# Patient Record
Sex: Female | Born: 1955 | Race: Black or African American | Hispanic: No | Marital: Married | Smoking: Never smoker
Health system: Southern US, Community
[De-identification: ages and names within clinical notes are randomized; demographics above are authoritative.]

---

## 2007-04-13 ENCOUNTER — Emergency Department: Payer: Self-pay

## 2008-06-11 ENCOUNTER — Inpatient Hospital Stay (HOSPITAL_COMMUNITY): Admission: EM | Admit: 2008-06-11 | Discharge: 2008-06-13 | Payer: Self-pay | Admitting: Emergency Medicine

## 2008-06-12 ENCOUNTER — Encounter (INDEPENDENT_AMBULATORY_CARE_PROVIDER_SITE_OTHER): Payer: Self-pay | Admitting: General Surgery

## 2010-11-13 NOTE — Op Note (Signed)
NAMEAIMA, MCWHIRT            ACCOUNT NO.:  [id number]   MEDICAL RECORD NO.:  [id number]          PATIENT TYPE:  INP   LOCATION:  5530                         FACILITY:  [facility]   PHYSICIAN:  Anselm Pancoast. Weatherly, M.D.DATE OF BIRTH:  15-Aug-1955   DATE OF PROCEDURE:  06/12/2008  DATE OF DISCHARGE:                               OPERATIVE REPORT   PREOPERATIVE DIAGNOSIS:  Acute cholecystitis with stones.   POSTOPERATIVE DIAGNOSIS:  Acute cholecystitis with stones.   OPERATION:  Laparoscopic cholecystectomy with cholangiogram.   SURGEON:  Anselm Pancoast. Zachery Dakins, MD   ASSISTANT:  Gabrielle Dare. Janee Morn, MD   ANESTHESIA:  General anesthesia.   HISTORY:  Breanna Welch is a 55 year old female who presented to the  emergency room yesterday with following history.  She said for about 3  weeks in a row after heavy meal and this has usually been Sunday lunch  after attending church.  She had severe epigastric pain predominantly in  right upper quadrant.  The pain at this time started on Saturday and she  presented to the emergency room.  She was seen by the ER physician and  was thought to have a gallbladder problem.  Ultrasound confirmed that  she had a thickened gallbladder with stones, and she was admitted and  Dr. Corliss Skains saw her and suggested that we plan on doing it tomorrow.  The  patient was started on Unasyn.  I saw her this morning, she states  actually she was feeling not worse, but of course she had not eaten.  She was still kind of slightly tender in the right upper quadrant and I  recommended we will proceed on laparoscopic cholecystectomy with  cholangiogram.  The patient was in agreement.  She had her dose of  Unasyn ordered for noon preoperatively and then positioned on OR table,  induction of  general anesthesia and endotracheal tube, oral tube to the  stomach, Dr. Katrinka Blazing the Anesthesiologist, and then the abdomen was  prepped with Betadine surgical solution and draped in a  sterile manner.  She has had a previous midline incision from a hysterectomy.  I made a  little incision right below the umbilicus.  The fascia was identified  and then carefully opened.  The underlying omentum was adherent to this,  but I went superiorly towards the subxiphoid area and could enter into  the preperitoneal cavity.  A pursestring suture of 0 Vicryl was placed  and Hasson cannula introduced.  She has a markedly inflamed gallbladder  thickened and the upper 10-mm trocar was placed in the subxiphoid area  after anesthetizing the fascia with Marcaine, a 2 lateral 5-mm trocars  were placed by Dr. Janee Morn.  First with the markedly inflamed  gallbladder ver tense, we could not grab it, and we aspirated with  Nezhat-Dorsey aspirator and then the adhesions were of acute  inflammatory.  We kind of swept away so that we could see the proximal  portion of the gallbladder.  We could see a large stone impacted in the  neck of the gallbladder cystic duct area, and I very carefully dissected  through this, could not  actually identify a lymph node, but I could see  where the lymph node is inferior and then that was carefully dissected  with right angle between the lymph node to gallbladder wall which gave  me a little window.  I could then sort of get behind the actual cystic  duct area, and I put a clip across the junction of the cystic duct  gallbladder.  The proximal cystic duct which appeared a low generous,  was opened and then a Cook catheter was introduced that I could hold in  place with a clip and then the x-ray was obtained and it showed a good  prompt filling of extrahepatic biliary cyst and it looks like we have  got about 1.5 cm cystic duct length.  I then removed the catheter and  kind of dissected slightly more proximal, so we can get around the  actual true cystic duct, it looks as if we first kind of little opening  was probably right at the cystic duct gallbladder junction  area, and  then I could get 3 clips across the cystic duct, divided it, and then we  very carefully divided this inflammatory area, could not actually see a  posterior cystic artery, but I placed a clip across something that may  have been the posterior artery and divided it.  We very cautiously and  carefully dissected the more proximal portion of the gallbladder.  There  was another little area that may have been sort of a branch of the  anterior branch of the cystic artery that was clipped, and then we were  kind of in the plane of the gallbladder liver junction.  This was kind  of carefully dissected using predominantly the spatula or  electrocautery, kind of freeing up everything in this big thick  inflammatory field.  They did not enter into the gallbladder.  She has  got numerous fairly good size stones in addition to a normal stone in  the most distal portion and after the gallbladder was freed, we put it  into the Endocatch bag.  I then kind of cauterized the liver bed and  looked like we have got good hemostasis, and then we switched the camera  to the upper 10-mm trocar and withdrew the gallbladder within the  Endocatch bag.  I opened the little incision just a little bit more  across to the umbilicus and when the gallbladder was out and I put an  additional figure-of-eight of 0 Vicryl in addition to the original  pursestring tied on both and then anesthetized the fascia.  The  irrigating fluid that we used was aspirated.  There were no evidence of  any bleeding or bile from the proximal gallbladder fossa area and the  omentum goes up into the area quite nicely and I did not use any  Surgicel, and we will keep her on.  The subcutaneous wounds were closed  with 4-0 Vicryl.  Benzoin and Steri-Strips on the skin.  We will keep  her tonight, gave her another 1 or 2 dose of antibiotics, and she should  be able to be discharged in the a.m.            ______________________________  Anselm Pancoast. Zachery Dakins, M.D.     WJW/MEDQ  D:  06/12/2008  T:  06/13/2008  Job:  [id number]

## 2010-11-13 NOTE — H&P (Signed)
Breanna Welch, Breanna Welch            ACCOUNT NO.:  [id number]   MEDICAL RECORD NO.:  [id number]          PATIENT TYPE:  INP   LOCATION:  5530                         FACILITY:  [facility]   PHYSICIAN:  Wilmon Arms. Corliss Skains, M.D. DATE OF BIRTH:  1955-12-26   DATE OF ADMISSION:  06/11/2008  DATE OF DISCHARGE:                              HISTORY & PHYSICAL   CHIEF COMPLAINT:  Right upper quadrant epigastric pain.   HISTORY OF PRESENT ILLNESS:  This is a 55 year old female who presents  with a 3-week history of four episodes of right upper quadrant  epigastric pain.  These are usually associated with large meals  typically after church on Sundays.  This is all associated with nausea,  vomiting, diarrhea, and bloating.  She denies any unusual color to her  bowel movements or her vomitus or urine.  Her skin does not appear  different color.  Since the pain kept recurring, the patient presented  to the emergency department for evaluation.   PAST MEDICAL HISTORY:  None.   PAST SURGICAL HISTORY:  Hysterectomy many years ago.   FAMILY HISTORY:  Unremarkable.   SOCIAL HISTORY:  Nonsmoker, nondrinker.   ALLERGIES:  None.   MEDICATIONS:  None.   REVIEW OF SYSTEMS:  Otherwise negative.   PHYSICAL EXAMINATION:  VITAL SIGNS:  Temp 97.8, pulse 83, respirations  18, blood pressure 139/94.  GENERAL:  This is a well-developed, well-nourished female in no apparent  distress.  HEENT:  EOMI.  Sclerae anicteric.  NECK:  No mass, no thyromegaly.  LUNGS:  Clear.  Normal respiratory effort.  HEART:  Regular rate and rhythm.  No murmur.  ABDOMEN:  Positive bowel sounds, soft, tender in the right upper  quadrant and epigastrium.  No palpable masses.  No rebound or guarding.  EXTREMITIES:  No edema.  SKIN:  Warm and dry.   LABORATORY DATA:  White count 9.7, hemoglobin 11.8.  Electrolytes within  normal limits.  Total bilirubin 0.5, alk phos 76, lipase 33.  Ultrasound  shows some gallbladder wall thickening  with pericholecystic fluid and  positive sonographic Murphy sign and cholelithiasis.   IMPRESSION:  Acute cholecystitis.   PLAN:  Admit for IV hydration and IV antibiotics.  We will plan for a  laparoscopic cholecystectomy probably tomorrow.      Wilmon Arms. Tsuei, M.D.  Electronically Signed     MKT/MEDQ  D:  06/11/2008  T:  06/12/2008  Job:  [id number]

## 2011-04-05 LAB — COMPREHENSIVE METABOLIC PANEL
AST: 18 U/L (ref 0–37)
AST: 25 U/L (ref 0–37)
Albumin: 3.9 g/dL (ref 3.5–5.2)
BUN: 3 mg/dL — ABNORMAL LOW (ref 6–23)
BUN: 7 mg/dL (ref 6–23)
CO2: 28 mEq/L (ref 19–32)
Calcium: 10.1 mg/dL (ref 8.4–10.5)
Calcium: 9.1 mg/dL (ref 8.4–10.5)
Creatinine, Ser: 0.71 mg/dL (ref 0.4–1.2)
Creatinine, Ser: 0.79 mg/dL (ref 0.4–1.2)
GFR calc Af Amer: >60 mL/min (ref >60)
GFR calc Af Amer: >60 mL/min (ref >60)
GFR calc non Af Amer: >60 mL/min (ref >60)
Glucose, Bld: 119 mg/dL — ABNORMAL HIGH (ref 70–99)
Total Bilirubin: 0.5 mg/dL (ref 0.3–1.2)
Total Protein: 8.1 g/dL (ref 6.0–8.3)

## 2011-04-05 LAB — DIFFERENTIAL
Basophils Absolute: 0 10*3/uL (ref 0.0–0.1)
Eosinophils Relative: 2 % (ref 0–5)
Lymphocytes Relative: 25 % (ref 12–46)
Lymphs Abs: 2.4 10*3/uL (ref 0.7–4.0)
Monocytes Absolute: 0.5 10*3/uL (ref 0.1–1.0)
Monocytes Relative: 5 % (ref 3–12)
Neutro Abs: 6.6 10*3/uL (ref 1.7–7.7)

## 2011-04-05 LAB — CBC
HCT: 36.3 % (ref 36.0–46.0)
Hemoglobin: 10.8 g/dL — ABNORMAL LOW (ref 12.0–15.0)
MCHC: 32.4 g/dL (ref 30.0–36.0)
MCHC: 33 g/dL (ref 30.0–36.0)
MCV: 82.9 fL (ref 78.0–100.0)
Platelets: 330 10*3/uL (ref 150–400)
RDW: 13.7 % (ref 11.5–15.5)
WBC: 9.1 10*3/uL (ref 4.0–10.5)
WBC: 9.7 10*3/uL (ref 4.0–10.5)

## 2011-04-05 LAB — URINALYSIS, ROUTINE W REFLEX MICROSCOPIC
Nitrite: NEGATIVE
Specific Gravity, Urine: 1.023 (ref 1.005–1.030)
Urobilinogen, UA: 0.2 mg/dL (ref 0.0–1.0)
pH: 5.5 (ref 5.0–8.0)

## 2011-04-05 LAB — URINE MICROSCOPIC-ADD ON

## 2012-01-07 ENCOUNTER — Ambulatory Visit: Payer: Self-pay | Admitting: Orthopedic Surgery

## 2012-01-07 LAB — URINALYSIS, COMPLETE
Bilirubin,UR: NEGATIVE
Glucose,UR: NEGATIVE mg/dL (ref 0–75)
Nitrite: NEGATIVE
Ph: 5 (ref 4.5–8.0)
RBC,UR: 2 /HPF (ref 0–5)

## 2012-01-07 LAB — CBC
Platelet: 266 10*3/uL (ref 150–440)
RBC: 4.23 10*6/uL (ref 3.80–5.20)
WBC: 6.1 10*3/uL (ref 3.6–11.0)

## 2012-01-07 LAB — BASIC METABOLIC PANEL
Anion Gap: 7 (ref 7–16)
Calcium, Total: 9.6 mg/dL (ref 8.5–10.1)
Co2: 27 mmol/L (ref 21–32)
Creatinine: 0.88 mg/dL (ref 0.60–1.30)
EGFR (African American): >60

## 2012-01-07 LAB — PROTIME-INR
INR: 1
Prothrombin Time: 13.3 secs (ref 11.5–14.7)

## 2012-01-15 ENCOUNTER — Ambulatory Visit: Payer: Self-pay | Admitting: Orthopedic Surgery

## 2013-04-19 ENCOUNTER — Emergency Department: Payer: Self-pay | Admitting: Emergency Medicine

## 2013-04-19 LAB — COMPREHENSIVE METABOLIC PANEL
Alkaline Phosphatase: 90 U/L (ref 50–136)
BUN: 14 mg/dL (ref 7–18)
Bilirubin,Total: 0.2 mg/dL (ref 0.2–1.0)
Calcium, Total: 9.5 mg/dL (ref 8.5–10.1)
Co2: 29 mmol/L (ref 21–32)
Glucose: 98 mg/dL (ref 65–99)
Potassium: 3.4 mmol/L — ABNORMAL LOW (ref 3.5–5.1)
SGOT(AST): 23 U/L (ref 15–37)
SGPT (ALT): 24 U/L (ref 12–78)

## 2013-04-19 LAB — CBC
HGB: 12.1 g/dL (ref 12.0–16.0)
MCV: 82 fL (ref 80–100)
RDW: 14.3 % (ref 11.5–14.5)
WBC: 10.9 10*3/uL (ref 3.6–11.0)

## 2013-04-19 LAB — URINALYSIS, COMPLETE
Ketone: NEGATIVE
Leukocyte Esterase: NEGATIVE
Nitrite: NEGATIVE
Ph: 7 (ref 4.5–8.0)
Squamous Epithelial: 1

## 2013-04-19 LAB — LIPASE, BLOOD: Lipase: 195 U/L (ref 73–393)

## 2013-04-19 LAB — CK TOTAL AND CKMB (NOT AT ARMC)
CK, Total: 336 U/L — ABNORMAL HIGH (ref 21–215)
CK-MB: 3.7 ng/mL — ABNORMAL HIGH (ref 0.5–3.6)

## 2013-04-19 LAB — PROTIME-INR: Prothrombin Time: 13.2 secs (ref 11.5–14.7)

## 2013-04-29 ENCOUNTER — Ambulatory Visit: Payer: Self-pay | Admitting: Family Medicine

## 2016-03-27 ENCOUNTER — Ambulatory Visit: Payer: Self-pay

## 2016-03-27 ENCOUNTER — Encounter: Payer: Self-pay | Admitting: *Deleted

## 2016-03-27 ENCOUNTER — Ambulatory Visit: Payer: Self-pay | Attending: Oncology | Admitting: *Deleted

## 2016-03-27 VITALS — BP 135/80 | HR 65 | Temp 98.6°F | Resp 20 | Ht 62.21 in | Wt 169.4 lb

## 2016-03-27 DIAGNOSIS — N6489 Other specified disorders of breast: Secondary | ICD-10-CM

## 2016-03-27 NOTE — Progress Notes (Signed)
Subjective:     Patient ID: Breanna Welch, female   DOB: [birth year], 60 y.o.   MRN: [id number]  HPI   Review of Systems     Objective:   Physical Exam  Pulmonary/Chest: Right breast exhibits no inverted nipple, no mass, no nipple discharge, no skin change and no tenderness. Left breast exhibits no inverted nipple, no mass, no nipple discharge, no skin change and no tenderness. Breasts are symmetrical.         Assessment:     60 year old Black female presents to [facility] for clinical breast exam and mammogram only.  Clinical breast exam with an asymmetric thickening in the upper outer quadrant of the left breast.  No dominant mass, skin changes or lymphadenopathy.  Taught self breast awareness.  Patient with partial hysterectomy in her 20's for a tubal pregnancy.  States she has one ovary and fallopian tube only.  Pap smear omitted per protocol. Patient has been screened for eligibility.  She does not have any insurance, Medicare or Medicaid.  She also meets financial eligibility.  Hand-out given on the Affordable Care Act.    Plan:     Bilateral diagnostic mammogram and ultrasound ordered.  Will follow up per [facility] protocol.

## 2016-03-27 NOTE — Patient Instructions (Signed)
Gave patient hand-out, Women Staying Healthy, Active and Well from [facility], with education on breast health, pap smears, heart and colon health. 

## 2016-04-09 ENCOUNTER — Ambulatory Visit
Admission: RE | Admit: 2016-04-09 | Discharge: 2016-04-09 | Disposition: A | Discharge status: Home / Self Care | Payer: Self-pay | Source: Ambulatory Visit | Attending: Oncology | Admitting: Oncology

## 2016-04-09 ENCOUNTER — Other Ambulatory Visit: Payer: Self-pay | Admitting: Oncology

## 2016-04-09 ENCOUNTER — Other Ambulatory Visit: Payer: Self-pay | Admitting: *Deleted

## 2016-04-09 DIAGNOSIS — N6489 Other specified disorders of breast: Secondary | ICD-10-CM

## 2016-04-09 DIAGNOSIS — N63 Unspecified lump in unspecified breast: Secondary | ICD-10-CM

## 2016-05-14 ENCOUNTER — Telehealth: Payer: Self-pay | Admitting: *Deleted

## 2016-05-14 NOTE — Telephone Encounter (Signed)
Patient had a birads 4 mammogram on 04/09/16.  She had told Samantha in the breast center she would call her back to schedule her biopsy.  It has been over a month and the patient had not scheduled an appointment.  Called her today.  States she is so afraid of biopsies because she "lost her mom 2 weeks after a biopsy."  Stressed importance of early detection.  She has agreed to schedule.  Could not get through to the breast center.  Sent in-basket message to [place] to schedule the patient.

## 2016-05-28 ENCOUNTER — Ambulatory Visit: Payer: Self-pay

## 2016-06-11 ENCOUNTER — Ambulatory Visit
Admission: RE | Admit: 2016-06-11 | Discharge: 2016-06-11 | Disposition: A | Discharge status: Home / Self Care | Payer: Self-pay | Source: Ambulatory Visit | Attending: Oncology | Admitting: Oncology

## 2016-06-11 DIAGNOSIS — N63 Unspecified lump in unspecified breast: Secondary | ICD-10-CM

## 2016-06-11 HISTORY — PX: BREAST BIOPSY: SHX20

## 2016-06-12 LAB — SURGICAL PATHOLOGY

## 2016-06-13 ENCOUNTER — Telehealth: Payer: Self-pay | Admitting: *Deleted

## 2016-06-13 NOTE — Telephone Encounter (Signed)
Called and informed patient of her benign breast biopsy.  Told her the radiologist will still need to look a the path report, and determine if everything was concordant.  Informed she may also hear from the breast center with her results.  Informed that she will need to return in one year for annual screening, unless the radiologist wants her back in six months.  I will let her know if anything changes.

## 2018-09-24 ENCOUNTER — Other Ambulatory Visit: Payer: Self-pay | Admitting: Family Medicine

## 2018-09-24 DIAGNOSIS — Z1231 Encounter for screening mammogram for malignant neoplasm of breast: Secondary | ICD-10-CM

## 2018-10-26 ENCOUNTER — Encounter: Payer: Self-pay | Admitting: *Deleted

## 2018-11-02 ENCOUNTER — Other Ambulatory Visit: Payer: Self-pay

## 2018-11-02 ENCOUNTER — Telehealth: Payer: Self-pay | Admitting: Gastroenterology

## 2018-11-02 DIAGNOSIS — Z1211 Encounter for screening for malignant neoplasm of colon: Secondary | ICD-10-CM

## 2018-11-02 NOTE — Telephone Encounter (Signed)
Gastroenterology Pre-Procedure Review  Request Date:12/25/18 Requesting Physician: Dr. Alex Gardener  PATIENT REVIEW QUESTIONS: The patient responded to the following health history questions as indicated:    1. Are you having any GI issues? No 2. Do you have a personal history of Polyps? No 3. Do you have a family history of Colon Cancer or Polyps? No 4. Diabetes Mellitus? No 5. Joint replacements in the past 12 months?No 6. Major health problems in the past 3 months?No 7. Any artificial heart valves, MVP, or defibrillator?No    MEDICATIONS & ALLERGIES:    Patient reports the following regarding taking any anticoagulation/antiplatelet therapy:   Plavix, Coumadin, Eliquis, Xarelto, Lovenox, Pradaxa, Brilinta, or Effient? No Aspirin? No  Patient confirms/reports the following medications:  No current outpatient medications on file.   No current facility-administered medications for this visit.     Patient confirms/reports the following allergies:  No Known Allergies  No orders of the defined types were placed in this encounter.   AUTHORIZATION INFORMATION Primary Insurance: 1D#: Group #:  Secondary Insurance: 1D#: Group #:  SCHEDULE INFORMATION: Date: 12/25/18 Time: Location:[facility]

## 2018-11-02 NOTE — Telephone Encounter (Signed)
Pt is calling she received letter to schedule a colonoscopy

## 2018-12-07 ENCOUNTER — Telehealth: Payer: Self-pay

## 2018-12-07 NOTE — Telephone Encounter (Signed)
LVM for pt to call office.  We need to reschedule her  Friday 12/25/18 Colonoscopy with Dr. Bonna Gains to 12/24/18 (Thursday) with Dr. Bonna Gains.  Thanks [employer]

## 2018-12-09 ENCOUNTER — Telehealth: Payer: Self-pay

## 2018-12-09 NOTE — Telephone Encounter (Signed)
Patient was originally scheduled for 12/25/18 with Dr. Bonna Gains, due to scheduling changes at [facility] we have rescheduled her to 01/05/19 with Dr. Allen Norris at [facility].  Patient has been informed to have her COVID test on 01/01/19 Friday between the hours of 10:30am-12:30pm.  Will send new instructions to her.  Thanks [employer]

## 2019-01-01 ENCOUNTER — Other Ambulatory Visit
Admission: RE | Admit: 2019-01-01 | Discharge: 2019-01-01 | Disposition: A | Discharge status: Home / Self Care | Payer: Managed Care, Other (non HMO) | Source: Ambulatory Visit | Attending: Gastroenterology | Admitting: Gastroenterology

## 2019-01-01 DIAGNOSIS — Z01812 Encounter for preprocedural laboratory examination: Secondary | ICD-10-CM | POA: Insufficient documentation

## 2019-01-01 DIAGNOSIS — Z1159 Encounter for screening for other viral diseases: Secondary | ICD-10-CM | POA: Diagnosis not present

## 2019-01-02 LAB — SARS CORONAVIRUS 2 (TAT 6-24 HRS): SARS Coronavirus 2: NEGATIVE

## 2019-01-04 ENCOUNTER — Encounter: Payer: Self-pay | Admitting: Anesthesiology

## 2019-01-05 ENCOUNTER — Ambulatory Visit: Payer: Managed Care, Other (non HMO) | Admitting: Anesthesiology

## 2019-01-05 ENCOUNTER — Other Ambulatory Visit: Payer: Self-pay

## 2019-01-05 ENCOUNTER — Encounter
Admission: RE | Disposition: A | Discharge status: Home / Self Care | Payer: Self-pay | Source: Home / Self Care | Attending: Gastroenterology

## 2019-01-05 ENCOUNTER — Encounter: Payer: Self-pay | Admitting: *Deleted

## 2019-01-05 ENCOUNTER — Ambulatory Visit
Admission: RE | Admit: 2019-01-05 | Discharge: 2019-01-05 | Disposition: A | Discharge status: Home / Self Care | Payer: Managed Care, Other (non HMO) | Attending: Gastroenterology | Admitting: Gastroenterology

## 2019-01-05 DIAGNOSIS — Z1211 Encounter for screening for malignant neoplasm of colon: Secondary | ICD-10-CM | POA: Diagnosis not present

## 2019-01-05 DIAGNOSIS — K640 First degree hemorrhoids: Secondary | ICD-10-CM | POA: Diagnosis not present

## 2019-01-05 HISTORY — PX: COLONOSCOPY WITH PROPOFOL: SHX5780

## 2019-01-05 SURGERY — COLONOSCOPY WITH PROPOFOL
Anesthesia: General

## 2019-01-05 MED ORDER — SODIUM CHLORIDE 0.9 % IV SOLN
INTRAVENOUS | Status: DC
  Administered 2019-01-05: 08:00:00 via INTRAVENOUS

## 2019-01-05 MED ORDER — PROPOFOL 10 MG/ML IV BOLUS
INTRAVENOUS | Status: DC | PRN
  Administered 2019-01-05: 50 mg via INTRAVENOUS
  Administered 2019-01-05 (×4): 20 mg via INTRAVENOUS
  Administered 2019-01-05: 30 mg via INTRAVENOUS
  Administered 2019-01-05: 20 mg via INTRAVENOUS

## 2019-01-05 NOTE — H&P (Signed)
   Lucilla Lame, MD [facility] [address], [place], [place] [address] Phone: [phone] Fax : [phone]  Primary Care Physician:  Frazier Richards, MD Primary Gastroenterologist:  Dr. Allen Norris  Pre-Procedure History & Physical: HPI:  Breanna Welch is a 63 y.o. female is here for a screening colonoscopy.   History reviewed. No pertinent past medical history.  Past Surgical History:  Procedure Laterality Date  . BREAST BIOPSY Left 06/11/2016   path pending    Prior to Admission medications   Not on File    Allergies as of 11/02/2018  . (No Known Allergies)    Family History  Problem Relation Age of Onset  . Breast cancer Neg Hx     Social History   Socioeconomic History  . Marital status: Married    Spouse name: Not on file  . Number of children: Not on file  . Years of education: Not on file  . Highest education level: Not on file  Occupational History  . Not on file  Social Needs  . Financial resource strain: Not on file  . Food insecurity    Worry: Not on file    Inability: Not on file  . Transportation needs    Medical: Not on file    Non-medical: Not on file  Tobacco Use  . Smoking status: Never Smoker  . Smokeless tobacco: Never Used  Substance and Sexual Activity  . Alcohol use: Never    Frequency: Never  . Drug use: Never  . Sexual activity: Not on file  Lifestyle  . Physical activity    Days per week: Not on file    Minutes per session: Not on file  . Stress: Not on file  Relationships  . Social Herbalist on phone: Not on file    Gets together: Not on file    Attends religious service: Not on file    Active member of club or organization: Not on file    Attends meetings of clubs or organizations: Not on file    Relationship status: Not on file  . Intimate partner violence    Fear of current or ex partner: Not on file    Emotionally abused: Not on file    Physically abused: Not on file    Forced sexual activity: Not  on file  Other Topics Concern  . Not on file  Social History Narrative  . Not on file    Review of Systems: See HPI, otherwise negative ROS  Physical Exam: BP 133/76   Pulse 62   Temp (!) 97 F (36.1 C) (Tympanic)   Resp 18   Ht 5\' 4"  (1.626 m)   Wt 77.1 kg   SpO2 100%   BMI 29.18 kg/m  General:   Alert,  pleasant and cooperative in NAD Head:  Normocephalic and atraumatic. Neck:  Supple; no masses or thyromegaly. Lungs:  Clear throughout to auscultation.    Heart:  Regular rate and rhythm. Abdomen:  Soft, nontender and nondistended. Normal bowel sounds, without guarding, and without rebound.   Neurologic:  Alert and  oriented x4;  grossly normal neurologically.  Impression/Plan: Breanna Welch is now here to undergo a screening colonoscopy.  Risks, benefits, and alternatives regarding colonoscopy have been reviewed with the patient.  Questions have been answered.  All parties agreeable.

## 2019-01-05 NOTE — Transfer of Care (Signed)
Immediate Anesthesia Transfer of Care Note  Patient: Breanna Welch  Procedure(s) Performed: COLONOSCOPY WITH PROPOFOL (N/A )  Patient Location: Endoscopy Unit  Anesthesia Type:General  Level of Consciousness: awake and drowsy  Airway & Oxygen Therapy: Patient Spontanous Breathing and Patient connected to face mask oxygen  Post-op Assessment: Report given to RN and Post -op Vital signs reviewed and stable  Post vital signs: Reviewed and stable  Last Vitals:  Vitals Value Taken Time  BP    Temp    Pulse    Resp    SpO2      Last Pain:  Vitals:   01/05/19 0805  TempSrc: Tympanic  PainSc: 0-No pain         Complications: No apparent anesthesia complications

## 2019-01-05 NOTE — Anesthesia Preprocedure Evaluation (Signed)
Anesthesia Evaluation  Patient identified by MRN, date of birth, ID band Patient awake    Reviewed: Allergy & Precautions, NPO status , Patient's Chart, lab work & pertinent test results, reviewed documented beta blocker date and time   Airway Mallampati: II  TM Distance: >3 FB     Dental  (+) Chipped   Pulmonary           Cardiovascular      Neuro/Psych    GI/Hepatic   Endo/Other    Renal/GU      Musculoskeletal   Abdominal   Peds  Hematology   Anesthesia Other Findings   Reproductive/Obstetrics                             Anesthesia Physical Anesthesia Plan  ASA: II  Anesthesia Plan: General   Post-op Pain Management:    Induction: Intravenous  PONV Risk Score and Plan:   Airway Management Planned:   Additional Equipment:   Intra-op Plan:   Post-operative Plan:   Informed Consent: I have reviewed the patients History and Physical, chart, labs and discussed the procedure including the risks, benefits and alternatives for the proposed anesthesia with the patient or authorized representative who has indicated his/her understanding and acceptance.     Plan Discussed with: CRNA  Anesthesia Plan Comments:         Anesthesia Quick Evaluation  

## 2019-01-05 NOTE — Anesthesia Postprocedure Evaluation (Signed)
Anesthesia Post Note  Patient: Breanna Welch  Procedure(s) Performed: COLONOSCOPY WITH PROPOFOL (N/A )  Patient location during evaluation: Endoscopy Anesthesia Type: General Level of consciousness: awake and alert Pain management: pain level controlled Vital Signs Assessment: post-procedure vital signs reviewed and stable Respiratory status: spontaneous breathing, nonlabored ventilation, respiratory function stable and patient connected to nasal cannula oxygen Cardiovascular status: blood pressure returned to baseline and stable Postop Assessment: no apparent nausea or vomiting Anesthetic complications: no     Last Vitals:  Vitals:   01/05/19 0914 01/05/19 0924  BP: (!) 109/57 116/62  Pulse: (!) 56 61  Resp: 11 15  Temp:    SpO2: 100% 100%    Last Pain:  Vitals:   01/05/19 0924  TempSrc:   PainSc: 0-No pain                 Hendricks Schwandt S

## 2019-01-05 NOTE — Op Note (Signed)
[facility] Gastroenterology Patient Name: Breanna Welch Procedure Date: 01/05/2019 8:47 AM MRN: [id number] Account #: [id number] Date of Birth: Sep 28, 1955 Admit Type: Outpatient Age: 63 Room: [facility] ENDO ROOM 4 Gender: Female Note Status: Finalized Procedure:            Colonoscopy Indications:          Screening for colorectal malignant neoplasm Providers:            Lucilla Lame MD, MD Referring MD:         Hattie Perch. Adamo (Referring MD) Medicines:            Propofol per Anesthesia Complications:        No immediate complications. Procedure:            Pre-Anesthesia Assessment:                       - Prior to the procedure, a History and Physical was                        performed, and patient medications and allergies were                        reviewed. The patient's tolerance of previous                        anesthesia was also reviewed. The risks and benefits of                        the procedure and the sedation options and risks were                        discussed with the patient. All questions were                        answered, and informed consent was obtained. Prior                        Anticoagulants: The patient has taken no previous                        anticoagulant or antiplatelet agents. ASA Grade                        Assessment: II - A patient with mild systemic disease.                        After reviewing the risks and benefits, the patient was                        deemed in satisfactory condition to undergo the                        procedure.                       After obtaining informed consent, the colonoscope was                        passed under direct vision. Throughout the procedure,  the patient's blood pressure, pulse, and oxygen                        saturations were monitored continuously. The                        Colonoscope was introduced through the anus and       advanced to the the cecum, identified by appendiceal                        orifice and ileocecal valve. The colonoscopy was                        performed without difficulty. The patient tolerated the                        procedure well. The quality of the bowel preparation                        was excellent. Findings:      The perianal and digital rectal examinations were normal.      Non-bleeding internal hemorrhoids were found during retroflexion. The       hemorrhoids were Grade I (internal hemorrhoids that do not prolapse). Impression:           - Non-bleeding internal hemorrhoids.                       - No specimens collected. Recommendation:       - Discharge patient to home.                       - Resume previous diet.                       - Continue present medications. Procedure Code(s):    --- Professional ---                       [phone], Colonoscopy, flexible; diagnostic, including                        collection of specimen(s) by brushing or washing, when                        performed (separate procedure) Diagnosis Code(s):    --- Professional ---                       Z12.11, Encounter for screening for malignant neoplasm                        of colon CPT copyright 2019 American Medical Association. All rights reserved. The codes documented in this report are preliminary and upon coder review may  be revised to meet current compliance requirements. Lucilla Lame MD, MD 01/05/2019 9:04:54 AM This report has been signed electronically. Number of Addenda: 0 Note Initiated On: 01/05/2019 8:47 AM Scope Withdrawal Time: 0 hours 6 minutes 36 seconds  Total Procedure Duration: 0 hours 9 minutes 17 seconds  Estimated Blood Loss: Estimated blood loss: none.      [employer]

## 2019-01-05 NOTE — Anesthesia Post-op Follow-up Note (Signed)
Anesthesia QCDR form completed.        

## 2019-01-06 ENCOUNTER — Encounter: Payer: Self-pay | Admitting: Gastroenterology

## 2020-03-13 ENCOUNTER — Emergency Department
Admission: EM | Admit: 2020-03-13 | Discharge: 2020-03-13 | Disposition: A | Discharge status: Home / Self Care | Payer: Medicare Other | Attending: Emergency Medicine | Admitting: Emergency Medicine

## 2020-03-13 ENCOUNTER — Encounter: Payer: Self-pay | Admitting: Emergency Medicine

## 2020-03-13 ENCOUNTER — Other Ambulatory Visit: Payer: Self-pay

## 2020-03-13 DIAGNOSIS — R1031 Right lower quadrant pain: Secondary | ICD-10-CM | POA: Insufficient documentation

## 2020-03-13 DIAGNOSIS — R300 Dysuria: Secondary | ICD-10-CM | POA: Diagnosis not present

## 2020-03-13 DIAGNOSIS — Z5321 Procedure and treatment not carried out due to patient leaving prior to being seen by health care provider: Secondary | ICD-10-CM | POA: Insufficient documentation

## 2020-03-13 LAB — COMPREHENSIVE METABOLIC PANEL
ALT: 16 U/L (ref 0–44)
AST: 16 U/L (ref 15–41)
Albumin: 4.1 g/dL (ref 3.5–5.0)
Alkaline Phosphatase: 61 U/L (ref 38–126)
Anion gap: 9 (ref 5–15)
BUN: 16 mg/dL (ref 8–23)
CO2: 26 mmol/L (ref 22–32)
Calcium: 10.1 mg/dL (ref 8.9–10.3)
Chloride: 106 mmol/L (ref 98–111)
Creatinine, Ser: 0.89 mg/dL (ref 0.44–1.00)
GFR calc Af Amer: >60 mL/min (ref >60)
GFR calc non Af Amer: >60 mL/min (ref >60)
Glucose, Bld: 94 mg/dL (ref 70–99)
Potassium: 3.8 mmol/L (ref 3.5–5.1)
Sodium: 141 mmol/L (ref 135–145)
Total Bilirubin: 0.5 mg/dL (ref 0.3–1.2)
Total Protein: 8 g/dL (ref 6.5–8.1)

## 2020-03-13 LAB — LIPASE, BLOOD: Lipase: 42 U/L (ref 11–51)

## 2020-03-13 LAB — URINALYSIS, COMPLETE (UACMP) WITH MICROSCOPIC
Bilirubin Urine: NEGATIVE
Glucose, UA: NEGATIVE mg/dL
Ketones, ur: NEGATIVE mg/dL
Nitrite: NEGATIVE
Protein, ur: NEGATIVE mg/dL
Specific Gravity, Urine: 1.012 (ref 1.005–1.030)
pH: 6 (ref 5.0–8.0)

## 2020-03-13 LAB — CBC
HCT: 35.3 % — ABNORMAL LOW (ref 36.0–46.0)
Hemoglobin: 11.5 g/dL — ABNORMAL LOW (ref 12.0–15.0)
MCH: 26.9 pg (ref 26.0–34.0)
MCHC: 32.6 g/dL (ref 30.0–36.0)
MCV: 82.7 fL (ref 80.0–100.0)
Platelets: 349 10*3/uL (ref 150–400)
RBC: 4.27 MIL/uL (ref 3.87–5.11)
RDW: 13.6 % (ref 11.5–15.5)
WBC: 9 10*3/uL (ref 4.0–10.5)
nRBC: 0 % (ref 0.0–0.2)

## 2020-03-13 NOTE — ED Triage Notes (Signed)
Here for RLQ pain for 4 days. Reports temp at home but does not remember what. No vomiting or diarrhea.  Mild dysuria at end of stream.  Unlabored. VSS

## 2020-03-14 ENCOUNTER — Other Ambulatory Visit: Payer: Self-pay

## 2020-03-14 ENCOUNTER — Emergency Department
Admission: EM | Admit: 2020-03-14 | Discharge: 2020-03-14 | Disposition: A | Discharge status: Home / Self Care | Payer: Medicare Other | Attending: Emergency Medicine | Admitting: Emergency Medicine

## 2020-03-14 ENCOUNTER — Encounter: Payer: Self-pay | Admitting: Radiology

## 2020-03-14 ENCOUNTER — Emergency Department: Payer: Medicare Other

## 2020-03-14 DIAGNOSIS — R1031 Right lower quadrant pain: Secondary | ICD-10-CM | POA: Diagnosis not present

## 2020-03-14 DIAGNOSIS — N3000 Acute cystitis without hematuria: Secondary | ICD-10-CM

## 2020-03-14 DIAGNOSIS — Y999 Unspecified external cause status: Secondary | ICD-10-CM | POA: Diagnosis not present

## 2020-03-14 DIAGNOSIS — R300 Dysuria: Secondary | ICD-10-CM | POA: Diagnosis not present

## 2020-03-14 DIAGNOSIS — X58XXXA Exposure to other specified factors, initial encounter: Secondary | ICD-10-CM | POA: Insufficient documentation

## 2020-03-14 DIAGNOSIS — R109 Unspecified abdominal pain: Secondary | ICD-10-CM | POA: Diagnosis present

## 2020-03-14 DIAGNOSIS — T148XXA Other injury of unspecified body region, initial encounter: Secondary | ICD-10-CM

## 2020-03-14 DIAGNOSIS — Y939 Activity, unspecified: Secondary | ICD-10-CM | POA: Insufficient documentation

## 2020-03-14 DIAGNOSIS — Y9289 Other specified places as the place of occurrence of the external cause: Secondary | ICD-10-CM | POA: Diagnosis not present

## 2020-03-14 DIAGNOSIS — S39011A Strain of muscle, fascia and tendon of abdomen, initial encounter: Secondary | ICD-10-CM | POA: Diagnosis not present

## 2020-03-14 LAB — URINALYSIS, ROUTINE W REFLEX MICROSCOPIC
Bilirubin Urine: NEGATIVE
Glucose, UA: NEGATIVE mg/dL
Ketones, ur: NEGATIVE mg/dL
Nitrite: NEGATIVE
Protein, ur: NEGATIVE mg/dL
Specific Gravity, Urine: 1.013 (ref 1.005–1.030)
pH: 5 (ref 5.0–8.0)

## 2020-03-14 LAB — COMPREHENSIVE METABOLIC PANEL
ALT: 17 U/L (ref 0–44)
AST: 16 U/L (ref 15–41)
Albumin: 4.2 g/dL (ref 3.5–5.0)
Alkaline Phosphatase: 63 U/L (ref 38–126)
Anion gap: 9 (ref 5–15)
BUN: 13 mg/dL (ref 8–23)
CO2: 27 mmol/L (ref 22–32)
Calcium: 10.1 mg/dL (ref 8.9–10.3)
Chloride: 104 mmol/L (ref 98–111)
Creatinine, Ser: 0.87 mg/dL (ref 0.44–1.00)
GFR calc Af Amer: >60 mL/min (ref >60)
GFR calc non Af Amer: >60 mL/min (ref >60)
Glucose, Bld: 90 mg/dL (ref 70–99)
Potassium: 3.9 mmol/L (ref 3.5–5.1)
Sodium: 140 mmol/L (ref 135–145)
Total Bilirubin: 0.5 mg/dL (ref 0.3–1.2)
Total Protein: 8.3 g/dL — ABNORMAL HIGH (ref 6.5–8.1)

## 2020-03-14 LAB — CBC WITH DIFFERENTIAL/PLATELET
Abs Immature Granulocytes: 0.03 10*3/uL (ref 0.00–0.07)
Basophils Absolute: 0.1 10*3/uL (ref 0.0–0.1)
Basophils Relative: 1 %
Eosinophils Absolute: 0.1 10*3/uL (ref 0.0–0.5)
Eosinophils Relative: 2 %
HCT: 37.9 % (ref 36.0–46.0)
Hemoglobin: 11.9 g/dL — ABNORMAL LOW (ref 12.0–15.0)
Immature Granulocytes: 0 %
Lymphocytes Relative: 31 %
Lymphs Abs: 2.7 10*3/uL (ref 0.7–4.0)
MCH: 26.7 pg (ref 26.0–34.0)
MCHC: 31.4 g/dL (ref 30.0–36.0)
MCV: 85 fL (ref 80.0–100.0)
Monocytes Absolute: 0.5 10*3/uL (ref 0.1–1.0)
Monocytes Relative: 6 %
Neutro Abs: 5.4 10*3/uL (ref 1.7–7.7)
Neutrophils Relative %: 60 %
Platelets: 352 10*3/uL (ref 150–400)
RBC: 4.46 MIL/uL (ref 3.87–5.11)
RDW: 13.6 % (ref 11.5–15.5)
WBC: 8.8 10*3/uL (ref 4.0–10.5)
nRBC: 0 % (ref 0.0–0.2)

## 2020-03-14 MED ORDER — KETOROLAC TROMETHAMINE 30 MG/ML IJ SOLN
15.0000 mg | Freq: Once | INTRAMUSCULAR | Status: AC
  Administered 2020-03-14: 15 mg via INTRAVENOUS
  Filled 2020-03-14: qty 1

## 2020-03-14 MED ORDER — ACETAMINOPHEN 500 MG PO TABS: 1000.0000 mg | ORAL_TABLET | Freq: Once | ORAL | Status: DC

## 2020-03-14 MED ORDER — CEPHALEXIN 500 MG PO CAPS: 500.0000 mg | ORAL_CAPSULE | Freq: Four times a day (QID) | ORAL | 0 refills | Status: AC

## 2020-03-14 MED ORDER — MORPHINE SULFATE (PF) 2 MG/ML IV SOLN
2.0000 mg | Freq: Once | INTRAVENOUS | Status: DC
  Filled 2020-03-14: qty 1

## 2020-03-14 MED ORDER — IOHEXOL 300 MG/ML  SOLN
100.0000 mL | Freq: Once | INTRAMUSCULAR | Status: AC | PRN
  Administered 2020-03-14: 100 mL via INTRAVENOUS

## 2020-03-14 NOTE — Discharge Instructions (Addendum)
Please take Tylenol and ibuprofen/Advil for your pain.  It is safe to take them together, or to alternate them every few hours.  Take up to 1000mg  of Tylenol at a time, up to 4 times per day.  Do not take more than 4000 mg of Tylenol in 24 hours.  For ibuprofen, take 400-600 mg, 4-5 times per day.  Your CT scan and blood work were normal.  With your symptoms of burning, your urine does have some signs of infection.  You are being discharged a prescription for Keflex antibiotic to take 4 times daily for the next 3 days to treat early UTI.  Please finish all these pills even if you are feeling better.  Return to the ED with any worsening symptoms.

## 2020-03-14 NOTE — ED Provider Notes (Signed)
[facility] Emergency Department Provider Note ____________________________________________   First MD Initiated Contact with Patient 03/14/20 1117     (approximate)  I have reviewed the triage vital signs and the nursing notes.  HISTORY  Chief Complaint Abdominal Pain   HPI Breanna Welch is a 64 y.o. femalewho presents to the ED for evaluation of abdominal pain.   Chart review indicates screening colonoscopy 1 year ago with asymptomatic internal hemorrhoids, otherwise normal. Patient checked into her ED yesterday afternoon and had blood work drawn that was without acute pathology.  UA with 11-20 whites that is nitrite negative.  Due to wait times, she went home and then returned again this morning.    Patient reports 2 weeks of intermittent RLQ abdominal pain.  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  She reports the pain subsided with home ibuprofen use, but continued to return and is lasted longer than she expected.  She reports 7/10 aching RLQ pain that is constant and nonradiating.  She reports developing associated dysuria over the past 1-2 weeks, which is new.  She reports associated subjective fevers and chills without documented temperatures.  Patient denies any vaginal discharge or bleeding, diarrhea, vomiting, chest pain, shortness of breath or syncope.  History reviewed. No pertinent past medical history.  Patient Active Problem List   Diagnosis Date Noted  . Encounter for screening colonoscopy     Past Surgical History:  Procedure Laterality Date  . BREAST BIOPSY Left 06/11/2016   path pending  . COLONOSCOPY WITH PROPOFOL N/A 01/05/2019   Procedure: COLONOSCOPY WITH PROPOFOL;  Surgeon: Lucilla Lame, MD;  Location: [facility] ENDOSCOPY;  Service: Endoscopy;  Laterality: N/A;    Prior to Admission medications   Medication Sig Start  Date End Date Taking? Authorizing Provider  cephALEXin (KEFLEX) 500 MG capsule Take 1 capsule (500 mg total) by mouth 4 (four) times daily for 3 days. 03/14/20 03/17/20  Vladimir Crofts, MD    Allergies Patient has no known allergies.  Family History  Problem Relation Age of Onset  . Breast cancer Neg Hx     Social History Social History   Tobacco Use  . Smoking status: Never Smoker  . Smokeless tobacco: Never Used  Vaping Use  . Vaping Use: Never used  Substance Use Topics  . Alcohol use: Never  . Drug use: Never    Review of Systems  Constitutional: No fever/chills Eyes: No visual changes. ENT: No sore throat. Cardiovascular: Denies chest pain. Respiratory: Denies shortness of breath. Gastrointestinal: .  No nausea, no vomiting.  No diarrhea.  Positive for abdominal pain, constipation. Genitourinary: Positive for dysuria. Musculoskeletal: Negative for back pain. Skin: Negative for rash. Neurological: Negative for headaches, focal weakness or numbness. ____________________________________________   PHYSICAL EXAM:  VITAL SIGNS: Vitals:   03/14/20 0856 03/14/20 1147  BP: (!) 152/81 (!) 149/84  Pulse: 60 (!) 56  Resp: 16 16  Temp: 98 F (36.7 C)   SpO2: 99% 100%      Constitutional: Alert and oriented. Well appearing and in no acute distress. Eyes: Conjunctivae are normal. PERRL. EOMI. Head: Atraumatic. Nose: No congestion/rhinnorhea. Mouth/Throat: Mucous membranes are moist.  Oropharynx non-erythematous. Neck: No stridor. No cervical spine tenderness to palpation. Cardiovascular: Normal rate, regular rhythm. Grossly normal heart sounds.  Good peripheral circulation. Respiratory: Normal respiratory effort.  No retractions. Lungs CTAB. Gastrointestinal: Soft , nondistended. No  abdominal bruits. No CVA tenderness. RLQ and suprapubic tenderness to palpation without peritoneal features Musculoskeletal: No lower extremity tenderness nor edema.  No joint effusions.  No signs of acute trauma. Neurologic:  Normal speech and language. No gross focal neurologic deficits are appreciated. No gait instability noted. Skin:  Skin is warm, dry and intact. No rash noted. Psychiatric: Mood and affect are normal. Speech and behavior are normal.  ____________________________________________   LABS (all labs ordered are listed, but only abnormal results are displayed)  Labs Reviewed  COMPREHENSIVE METABOLIC PANEL - Abnormal; Notable for the following components:      Result Value   Total Protein 8.3 (*)    All other components within normal limits  CBC WITH DIFFERENTIAL/PLATELET - Abnormal; Notable for the following components:   Hemoglobin 11.9 (*)    All other components within normal limits  URINALYSIS, ROUTINE W REFLEX MICROSCOPIC - Abnormal; Notable for the following components:   Color, Urine YELLOW (*)    APPearance HAZY (*)    Hgb urine dipstick MODERATE (*)    Leukocytes,Ua SMALL (*)    Bacteria, UA RARE (*)    All other components within normal limits   ____________________________________________  RADIOLOGY  ED MD interpretation:   Official radiology report(s): CT ABDOMEN PELVIS W CONTRAST  Result Date: 03/14/2020 CLINICAL DATA:  Acute left lower quadrant abdominal pain. EXAM: CT ABDOMEN AND PELVIS WITH CONTRAST TECHNIQUE: Multidetector CT imaging of the abdomen and pelvis was performed using the standard protocol following bolus administration of intravenous contrast. CONTRAST:  17mL OMNIPAQUE IOHEXOL 300 MG/ML  SOLN COMPARISON:  April 19, 2013. FINDINGS: Lower chest: No acute abnormality. Hepatobiliary: No focal liver abnormality is seen. Status post cholecystectomy. No biliary dilatation. Pancreas: Unremarkable. No pancreatic ductal dilatation or surrounding inflammatory changes. Spleen: Normal in size without focal abnormality. Adrenals/Urinary Tract: Adrenal glands are unremarkable. Kidneys are normal, without renal calculi, focal lesion,  or hydronephrosis. Bladder is unremarkable. Stomach/Bowel: Stomach is within normal limits. Appendix appears normal. No evidence of bowel wall thickening, distention, or inflammatory changes. Vascular/Lymphatic: No significant vascular findings are present. No enlarged abdominal or pelvic lymph nodes. Reproductive: Status post hysterectomy. No adnexal masses. Other: No abdominal wall hernia or abnormality. No abdominopelvic ascites. Musculoskeletal: No acute or significant osseous findings. IMPRESSION: No acute abnormality seen in the abdomen or pelvis. Electronically Signed   By: Marijo Conception M.D.   On: 03/14/2020 13:54    ____________________________________________   PROCEDURES and INTERVENTIONS  Procedure(s) performed (including Critical Care):  Procedures  Medications  ketorolac (TORADOL) 30 MG/ML injection 15 mg (15 mg Intravenous Given 03/14/20 1230)  iohexol (OMNIPAQUE) 300 MG/ML solution 100 mL (100 mLs Intravenous Contrast Given 03/14/20 1333)    ____________________________________________   MDM / ED COURSE  64 year old woman presenting with RLQ abdominal pain most consistent with muscular strain versus acute cystitis and amenable to outpatient management.  Normal vital signs on room air.  Exam demonstrates a well-appearing patient without evidence of distress, neurovascular deficits or trauma.  She is mildly tender throughout her RLQ without peritoneal features, and otherwise has a benign abdomen and examination.  Blood work from yesterday and repeated today without acute derangements.  Urinalysis, considering her symptoms of dysuria, are most consistent with acute cystitis and so patient was started on a short course of Keflex and this urine was sent for culture.  CT imaging without evidence of acute pathology within her abdomen as a source of her pain.  Considering resolution after Toradol and her HPI,  she most likely had a muscular strain or spasm from that lifting mechanism 2  weeks ago with the source of her pain.  She has no evidence of pyelonephritis or more systemic illness require longer course of antibiotics, the patient was discharged with a 3-day course of Keflex to treat acute cystitis.  Patient has good follow-up with PCP.  We discussed return precautions for the ED.  Patient medically stable for discharge home.  Clinical Course as of Mar 14 1405  Tue Mar 14, 2020  1254 Reassessed.  Patient reports resolution of abdominal pain symptoms.  Awaiting CT imaging.  I advised her of my suspicion that this was a muscular spasm, but it is reasonable to go ahead with CT to be safe, and she is agreeable.  We discussed management of acute uncomplicated cystitis with short course of antibiotics.   [DS]  1403 Educated patient of reassuring CT results and plan to discharge with antibiotics to treat acute cystitis.  She is agreeable.   [DS]    Clinical Course User Index [DS] Vladimir Crofts, MD     ____________________________________________   FINAL CLINICAL IMPRESSION(S) / ED DIAGNOSES  Final diagnoses:  Dysuria  Acute cystitis without hematuria  RLQ abdominal pain  Muscle strain     ED Discharge Orders         Ordered    cephALEXin (KEFLEX) 500 MG capsule  4 times daily        03/14/20 1401           Meridian Scherger   Note:  This document was prepared using Systems analyst and may include unintentional dictation errors.   Vladimir Crofts, MD 03/14/20 [phone]

## 2020-03-14 NOTE — ED Notes (Signed)
Patient transported to CT 

## 2020-03-14 NOTE — ED Triage Notes (Signed)
Pt here with lower abd pain that has been going on for 1 week. Pt denies N, V, D. Pt seen here yesterday for same reason. Pt NAD in triage.

## 2020-03-14 NOTE — ED Notes (Signed)
Pt was here yesterday and does not want blood drawn again before seeing a provider.

## 2020-03-16 LAB — URINE CULTURE: Culture: >=100000 — AB

## 2020-08-28 ENCOUNTER — Other Ambulatory Visit: Payer: Self-pay | Admitting: Family Medicine

## 2020-08-28 DIAGNOSIS — Z1231 Encounter for screening mammogram for malignant neoplasm of breast: Secondary | ICD-10-CM

## 2020-08-28 DIAGNOSIS — D242 Benign neoplasm of left breast: Secondary | ICD-10-CM

## 2020-09-08 ENCOUNTER — Other Ambulatory Visit: Payer: PRIVATE HEALTH INSURANCE

## 2020-09-08 ENCOUNTER — Other Ambulatory Visit: Payer: Self-pay

## 2020-09-08 NOTE — Patient Outreach (Signed)
Care Coordination  09/08/2020  Rasheedah Reis Dula 1955-09-09 [id number]  Patient no longer has MM [employer].  Mickel Fuchs, BSW, [place]  High Risk Managed Medicaid Team

## 2021-10-03 ENCOUNTER — Other Ambulatory Visit: Payer: Self-pay | Admitting: Family Medicine

## 2021-10-03 DIAGNOSIS — Z0000 Encounter for general adult medical examination without abnormal findings: Secondary | ICD-10-CM | POA: Diagnosis not present

## 2021-10-03 DIAGNOSIS — Z1231 Encounter for screening mammogram for malignant neoplasm of breast: Secondary | ICD-10-CM | POA: Diagnosis not present

## 2021-10-03 DIAGNOSIS — Z780 Asymptomatic menopausal state: Secondary | ICD-10-CM | POA: Diagnosis not present

## 2021-10-03 DIAGNOSIS — Z1283 Encounter for screening for malignant neoplasm of skin: Secondary | ICD-10-CM | POA: Diagnosis not present

## 2021-10-03 DIAGNOSIS — E782 Mixed hyperlipidemia: Secondary | ICD-10-CM | POA: Diagnosis not present

## 2021-10-03 DIAGNOSIS — R7303 Prediabetes: Secondary | ICD-10-CM | POA: Diagnosis not present

## 2021-10-30 ENCOUNTER — Ambulatory Visit
Admission: RE | Admit: 2021-10-30 | Discharge: 2021-10-30 | Disposition: A | Discharge status: Home / Self Care | Payer: No Typology Code available for payment source | Source: Ambulatory Visit | Attending: Family Medicine | Admitting: Family Medicine

## 2021-10-30 DIAGNOSIS — Z1231 Encounter for screening mammogram for malignant neoplasm of breast: Secondary | ICD-10-CM | POA: Diagnosis not present

## 2021-11-30 DIAGNOSIS — D225 Melanocytic nevi of trunk: Secondary | ICD-10-CM | POA: Diagnosis not present

## 2021-11-30 DIAGNOSIS — D2261 Melanocytic nevi of right upper limb, including shoulder: Secondary | ICD-10-CM | POA: Diagnosis not present

## 2021-11-30 DIAGNOSIS — L821 Other seborrheic keratosis: Secondary | ICD-10-CM | POA: Diagnosis not present

## 2021-11-30 DIAGNOSIS — D2262 Melanocytic nevi of left upper limb, including shoulder: Secondary | ICD-10-CM | POA: Diagnosis not present

## 2021-11-30 DIAGNOSIS — D2272 Melanocytic nevi of left lower limb, including hip: Secondary | ICD-10-CM | POA: Diagnosis not present

## 2022-01-21 DIAGNOSIS — R7303 Prediabetes: Secondary | ICD-10-CM | POA: Diagnosis not present

## 2022-01-21 DIAGNOSIS — E785 Hyperlipidemia, unspecified: Secondary | ICD-10-CM | POA: Diagnosis not present

## 2022-03-27 DIAGNOSIS — Z008 Encounter for other general examination: Secondary | ICD-10-CM | POA: Diagnosis not present

## 2022-03-27 DIAGNOSIS — E669 Obesity, unspecified: Secondary | ICD-10-CM | POA: Diagnosis not present

## 2022-03-27 DIAGNOSIS — Z6830 Body mass index (BMI) 30.0-30.9, adult: Secondary | ICD-10-CM | POA: Diagnosis not present

## 2022-03-27 DIAGNOSIS — R7303 Prediabetes: Secondary | ICD-10-CM | POA: Diagnosis not present

## 2022-03-27 DIAGNOSIS — E785 Hyperlipidemia, unspecified: Secondary | ICD-10-CM | POA: Diagnosis not present

## 2022-04-17 DIAGNOSIS — R69 Illness, unspecified: Secondary | ICD-10-CM | POA: Diagnosis not present

## 2022-04-22 DIAGNOSIS — E782 Mixed hyperlipidemia: Secondary | ICD-10-CM | POA: Diagnosis not present

## 2022-04-22 DIAGNOSIS — E785 Hyperlipidemia, unspecified: Secondary | ICD-10-CM | POA: Diagnosis not present

## 2022-04-22 DIAGNOSIS — R7303 Prediabetes: Secondary | ICD-10-CM | POA: Diagnosis not present

## 2022-05-01 DIAGNOSIS — R69 Illness, unspecified: Secondary | ICD-10-CM | POA: Diagnosis not present

## 2022-06-09 IMAGING — MG MM DIGITAL SCREENING BILAT W/ TOMO AND CAD
8 series · 8 of 24 positions shown · non-contrast
Comparison: Previous exam(s).

CLINICAL DATA: Screening.

EXAM:
DIGITAL SCREENING BILATERAL MAMMOGRAM WITH TOMOSYNTHESIS AND CAD
TECHNIQUE: Bilateral screening digital craniocaudal and mediolateral oblique
mammograms were obtained. Bilateral screening digital breast
tomosynthesis was performed. The images were evaluated with
computer-aided detection.

[R CC synth-2D]
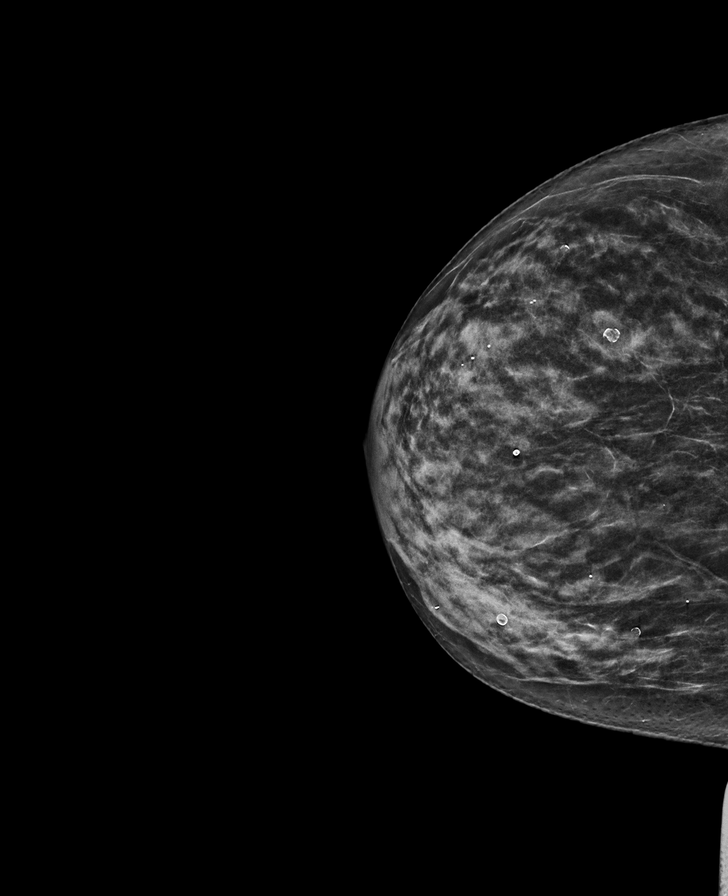

[L MLO synth-2D]
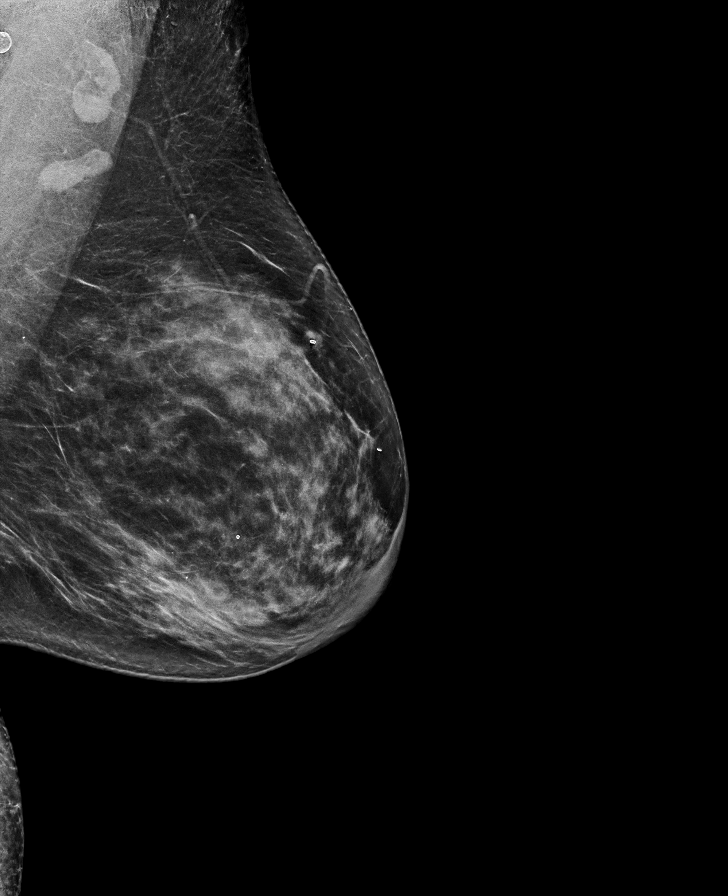

[R MLO synth-2D]
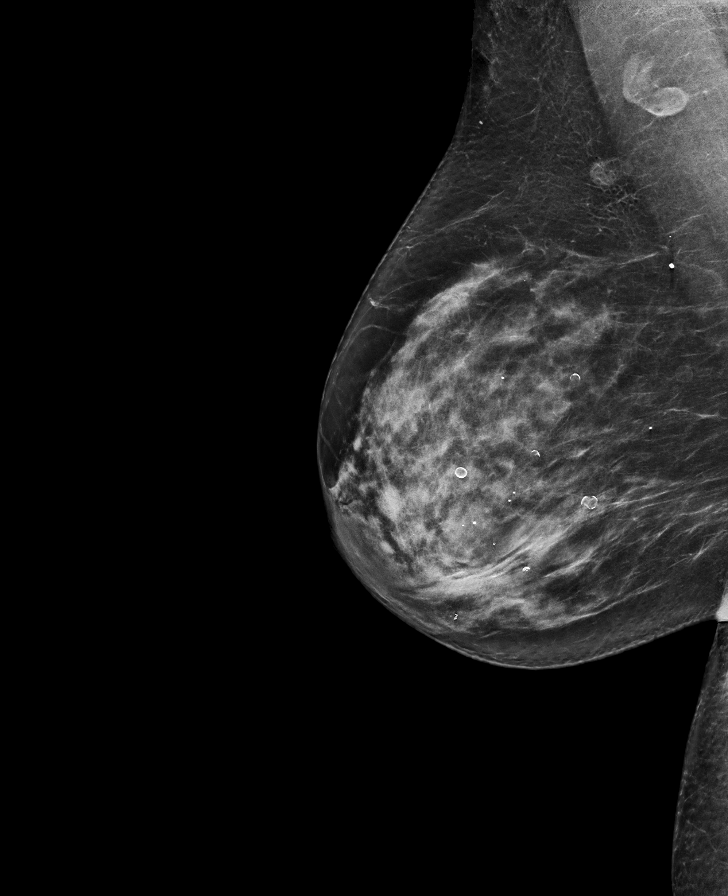

[L CC synth-2D]
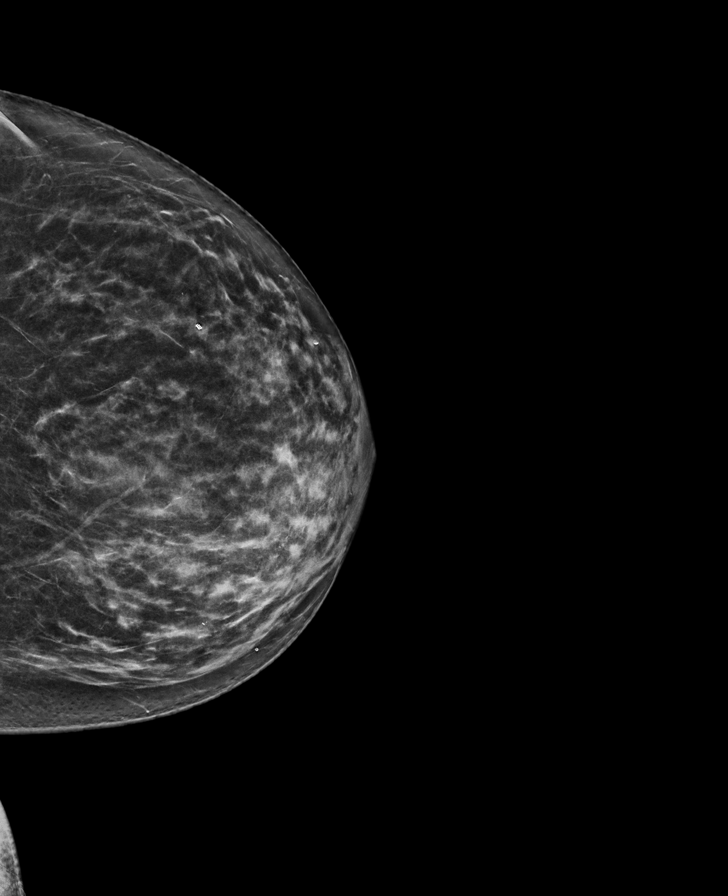

[R MLO tomo · tomo slice 39/78.0]
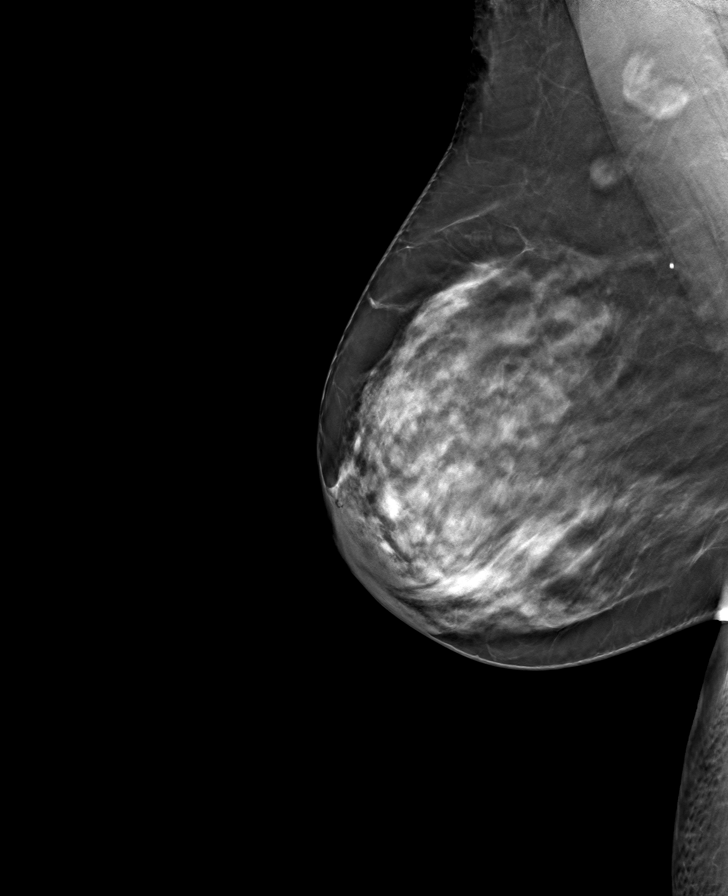

[R CC tomo · tomo slice 31/62.0]
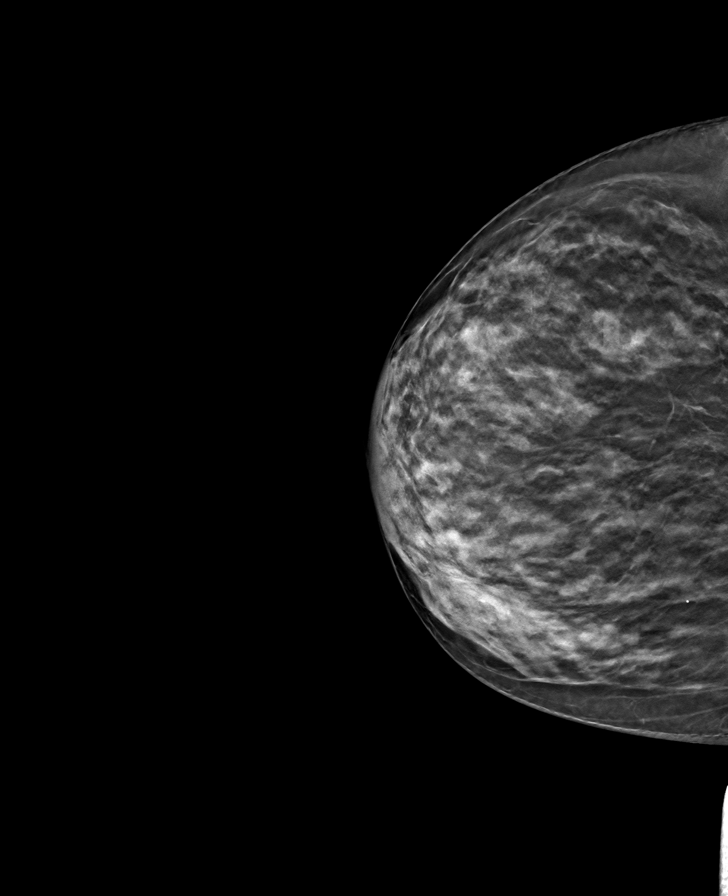

[L CC tomo · tomo slice 33/66.0]
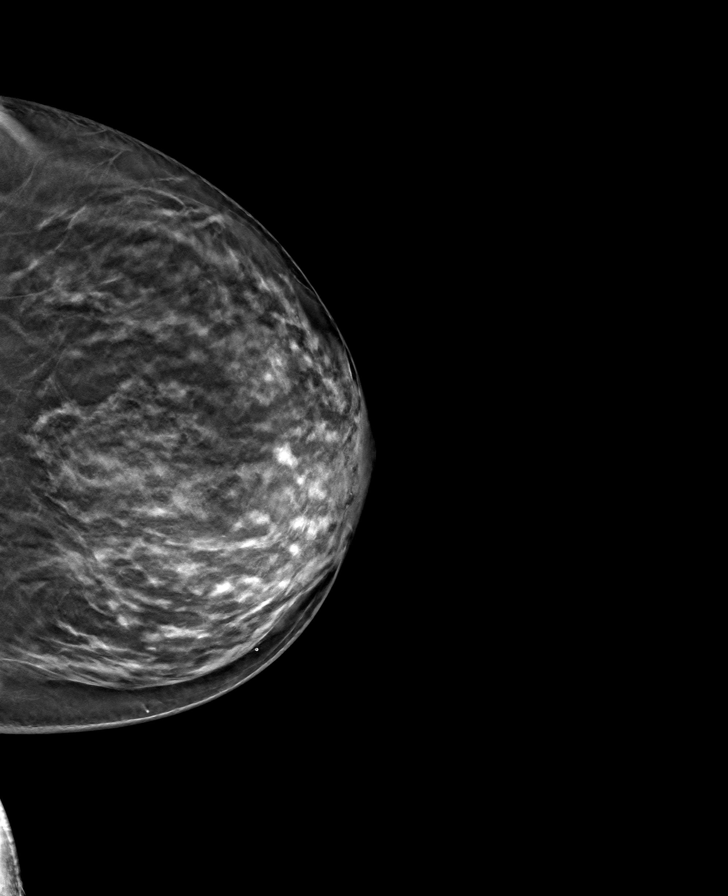

[L MLO tomo · tomo slice 41/80.0]
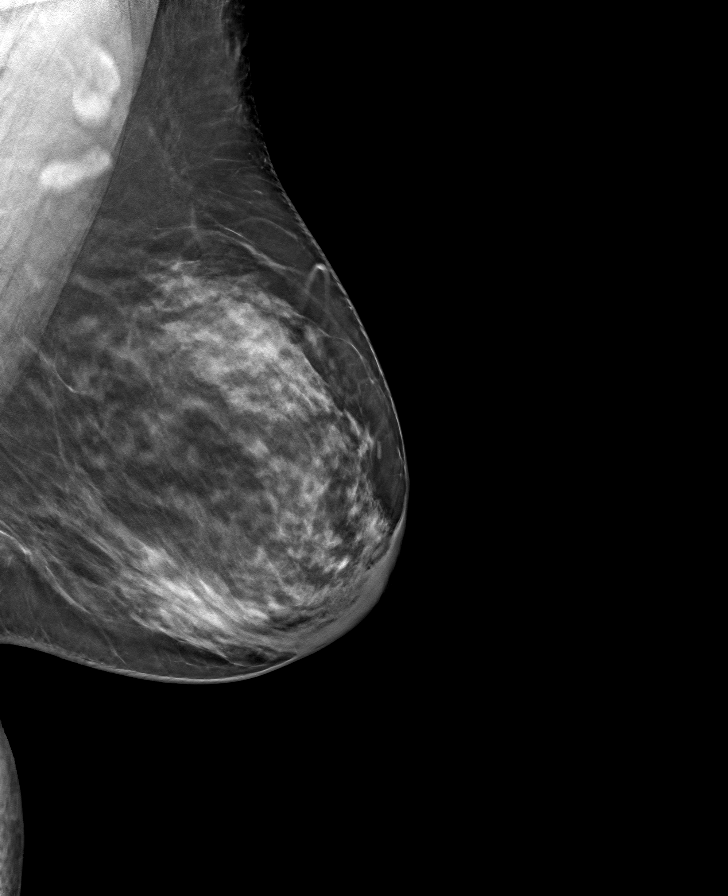

[8 of 24 positions shown; findings below may reference images not displayed]

ACR Breast Density Category c: The breast tissue is heterogeneously
dense, which may obscure small masses.
FINDINGS: There are no findings suspicious for malignancy.
IMPRESSION: No mammographic evidence of malignancy. A result letter of this
screening mammogram will be mailed directly to the patient.

RECOMMENDATION:
Screening mammogram in one year. (Code:Q3-W-BC3)

BI-RADS CATEGORY  1: Negative.

## 2022-07-22 DIAGNOSIS — E782 Mixed hyperlipidemia: Secondary | ICD-10-CM | POA: Diagnosis not present

## 2022-07-22 DIAGNOSIS — R7303 Prediabetes: Secondary | ICD-10-CM | POA: Diagnosis not present

## 2022-08-26 ENCOUNTER — Telehealth: Payer: Self-pay | Admitting: Licensed Clinical Social Worker

## 2022-08-26 NOTE — Telephone Encounter (Signed)
Patient lvm for LCSW, LCSW attempted to return call and lvm.

## 2022-08-27 ENCOUNTER — Telehealth: Payer: Self-pay | Admitting: Licensed Clinical Social Worker

## 2022-08-27 NOTE — Telephone Encounter (Signed)
Patient lvm for LCSW requesting a call back and reporting this is the second call. LCSW attempted to return call and lvm.

## 2022-08-29 ENCOUNTER — Telehealth: Payer: Self-pay | Admitting: Licensed Clinical Social Worker

## 2022-08-29 NOTE — Telephone Encounter (Signed)
LCSW spoke with patient - she reports looking for therapy for marital problems. LCSW encouraged patient to contact her insurance provider, Medicare and also to try [facility].

## 2022-12-30 ENCOUNTER — Other Ambulatory Visit: Payer: Self-pay | Admitting: Family Medicine

## 2022-12-30 DIAGNOSIS — Z1231 Encounter for screening mammogram for malignant neoplasm of breast: Secondary | ICD-10-CM

## 2023-07-03 ENCOUNTER — Ambulatory Visit
Admission: RE | Admit: 2023-07-03 | Discharge: 2023-07-03 | Disposition: A | Discharge status: Home / Self Care | Payer: No Typology Code available for payment source | Source: Ambulatory Visit | Attending: Family Medicine | Admitting: Family Medicine

## 2023-07-03 DIAGNOSIS — Z1231 Encounter for screening mammogram for malignant neoplasm of breast: Secondary | ICD-10-CM | POA: Insufficient documentation

## 2024-06-28 ENCOUNTER — Ambulatory Visit
Admission: EM | Admit: 2024-06-28 | Discharge: 2024-06-28 | Disposition: A | Discharge status: Home / Self Care | Source: Home / Self Care

## 2024-06-28 ENCOUNTER — Encounter: Payer: Self-pay | Admitting: Emergency Medicine

## 2024-06-28 ENCOUNTER — Ambulatory Visit

## 2024-06-28 ENCOUNTER — Other Ambulatory Visit

## 2024-06-28 DIAGNOSIS — S91115A Laceration without foreign body of left lesser toe(s) without damage to nail, initial encounter: Secondary | ICD-10-CM

## 2024-06-28 NOTE — Discharge Instructions (Signed)
 Today you are evaluated for the laceration to your toe  Sick sutures have been placed, return in 10 to 14 days for removal  Keep pressure bandage on until morning as this will help the area to stop bleeding  Clean at least once daily with unscented soap and water, easiest to do during normal hygiene  May cover with a nonstick Band-Aid if causing friction against socks and shoes or if at risk for becoming dirty  Please monitor for signs of infection such as puslike drainage, redness swelling or increased pain, if this occurs at any point please return to clinic  May take Tylenol  and/or Motrin as needed for pain  May elevate the foot to further help reduce any swelling  May return to clinic for any concerns

## 2024-06-28 NOTE — ED Triage Notes (Signed)
 Patient reports that states she got her her left pinky toe caught between a handle on vacuum. Patient now has a wound between toes. Denies pain at this time.

## 2024-06-29 DIAGNOSIS — S91115A Laceration without foreign body of left lesser toe(s) without damage to nail, initial encounter: Secondary | ICD-10-CM | POA: Diagnosis not present

## 2024-06-29 NOTE — ED Provider Notes (Signed)
 " [facility]    CSN: [id number] Arrival date & time: 06/28/24  1652      History   Chief Complaint Chief Complaint  Patient presents with   Toe Injury    wound    HPI Breanna Welch is a 68 y.o. female.   Patient presents for evaluation of an injury to the left fifth toe beginning 1 day prior.  Hitting the toe against the handle of the backing causing the wound.  Has cleaned and covered with a bandage.  History reviewed. No pertinent past medical history.  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      Home Medications    Prior to Admission medications  Medication Sig Start Date End Date Taking? Authorizing Provider  atorvastatin (LIPITOR) 10 MG tablet Take 10 mg by mouth daily. 03/28/22  Yes [provider]    Family History Family History  Problem Relation Age of Onset   Breast cancer Neg Hx     Social History Social History[1]   Allergies   Patient has no known allergies.   Review of Systems Review of Systems   Physical Exam Triage Vital Signs ED Triage Vitals  Encounter Vitals Group     BP 06/28/24 1830 (!) 152/83     Girls Systolic BP Percentile --      Girls Diastolic BP Percentile --      Boys Systolic BP Percentile --      Boys Diastolic BP Percentile --      Pulse Rate 06/28/24 1830 63     Resp 06/28/24 1830 20     Temp 06/28/24 1830 98.2 F (36.8 C)     Temp Source 06/28/24 1830 Oral     SpO2 06/28/24 1830 96 %     Weight --      Height --      Head Circumference --      Peak Flow --      Pain Score 06/28/24 1835 0     Pain Loc --       Pain Education --      Exclude from Growth Chart --    No data found.  Updated Vital Signs BP (!) 152/83 (BP Location: Right Arm)   Pulse 63   Temp 98.2 F (36.8 C) (Oral)   Resp 20   SpO2 96%   Visual Acuity Right Eye Distance:   Left Eye Distance:   Bilateral Distance:    Right Eye Near:   Left Eye Near:    Bilateral Near:     Physical Exam Constitutional:      Appearance: Normal appearance.  Eyes:     Extraocular Movements: Extraocular movements intact.  Pulmonary:     Effort: Pulmonary effort is normal.  Skin:    Comments: 2 cm laceration on the plantar aspect of the left fifth toe at the proximal phalanx and between the interdigital web  Neurological:     Mental Status: She is alert and oriented to person, place, and time. Mental status is at baseline.      UC Treatments /  Results  Labs (all labs ordered are listed, but only abnormal results are displayed) Labs Reviewed - No data to display  EKG   Radiology No results found.  Procedures Laceration Repair  Date/Time: 06/29/2024 8:40 AM  Performed by: Teresa Shelba SAUNDERS, NP Authorized by: Teresa Shelba SAUNDERS, NP   Consent:    Consent obtained:  Verbal   Consent given by:  Patient   Risks, benefits, and alternatives were discussed: yes     Risks discussed:  Infection and pain Universal protocol:    Procedure explained and questions answered to patient or proxy's satisfaction: yes     Patient identity confirmed:  Verbally with patient Anesthesia:    Anesthesia method:  Local infiltration   Local anesthetic:  Lidocaine 1% w/o epi Laceration details:    Location:  Toe   Toe location:  L little toe   Length (cm):  2   Depth (mm):  1 Pre-procedure details:    Preparation:  Patient was prepped and draped in usual sterile fashion Exploration:    Limited defect created (wound extended): no     Wound exploration: entire depth of wound visualized   Treatment:    Wound cleansed with: dermal wound  cleanser.   Amount of cleaning:  Standard Skin repair:    Repair method:  Sutures   Suture size:  3-0   Suture material:  Prolene   Suture technique:  Simple interrupted   Number of sutures:  6 Approximation:    Approximation:  Close Repair type:    Repair type:  Simple Post-procedure details:    Dressing:  Non-adherent dressing   Procedure completion:  Tolerated  (including critical care time)  Medications Ordered in UC Medications - No data to display  Initial Impression / Assessment and Plan / UC Course  I have reviewed the triage vital signs and the nursing notes.  Pertinent labs & imaging results that were available during my care of the patient were reviewed by me and considered in my medical decision making (see chart for details).  Laceration of the lesser toes of left foot without foreign body without damage to the nail  6 sutures placed advise removal in 10 to 14 days, pressure dressing applied to help stop bleeding, may remove in the morning, recommended daily cleansing and monitoring for signs of infection advised to follow-up for any concerns regarding healing Final Clinical Impressions(s) / UC Diagnoses   Final diagnoses:  Laceration of lesser toe of left foot without foreign body present or damage to nail, initial encounter     Discharge Instructions      Today you are evaluated for the laceration to your toe  Sick sutures have been placed, return in 10 to 14 days for removal  Keep pressure bandage on until morning as this will help the area to stop bleeding  Clean at least once daily with unscented soap and water, easiest to do during normal hygiene  May cover with a nonstick Band-Aid if causing friction against socks and shoes or if at risk for becoming dirty  Please monitor for signs of infection such as puslike drainage, redness swelling or increased pain, if this occurs at any point please return to clinic  May take Tylenol  and/or Motrin as needed  for pain  May elevate the foot to further help reduce any swelling  May return to clinic for any concerns   ED Prescriptions   None    PDMP not reviewed this encounter.    [1]  Social History Tobacco Use   Smoking status: Never   Smokeless tobacco: Never  Vaping Use   Vaping status: Never Used  Substance Use Topics   Alcohol use: Never   Drug use: Never     Teresa Shelba SAUNDERS, NP 06/29/24 [phone]  "

## 2024-07-12 ENCOUNTER — Encounter: Payer: Self-pay | Admitting: Emergency Medicine

## 2024-07-12 ENCOUNTER — Ambulatory Visit: Admission: EM | Admit: 2024-07-12 | Discharge: 2024-07-12 | Disposition: A | Discharge status: Home / Self Care

## 2024-07-12 DIAGNOSIS — Z4802 Encounter for removal of sutures: Secondary | ICD-10-CM

## 2024-07-12 NOTE — ED Triage Notes (Signed)
 Patient here to have sutures removed. Patient voices no other complaints.

## 2024-07-12 NOTE — ED Notes (Signed)
 5 sutures removed one remain Breanna Sage, NP look at wound agreed that patient should come back on Friday to have the last one removed.

## 2024-07-16 ENCOUNTER — Ambulatory Visit
Admission: RE | Admit: 2024-07-16 | Discharge: 2024-07-16 | Disposition: A | Discharge status: Home / Self Care | Source: Ambulatory Visit | Attending: Family Medicine | Admitting: Family Medicine

## 2024-07-16 DIAGNOSIS — Z4802 Encounter for removal of sutures: Secondary | ICD-10-CM

## 2024-07-16 NOTE — ED Triage Notes (Signed)
 Patient here to have one suture removed.
